# Patient Record
Sex: Male | Born: 1985 | Race: Black or African American | Hispanic: No | Marital: Married | Smoking: Never smoker
Health system: Southern US, Community
[De-identification: ages and names within clinical notes are randomized; demographics above are authoritative.]

---

## 2016-10-21 ENCOUNTER — Encounter (HOSPITAL_BASED_OUTPATIENT_CLINIC_OR_DEPARTMENT_OTHER): Payer: Self-pay | Admitting: *Deleted

## 2016-10-21 ENCOUNTER — Emergency Department (HOSPITAL_BASED_OUTPATIENT_CLINIC_OR_DEPARTMENT_OTHER)
Admission: EM | Admit: 2016-10-21 | Discharge: 2016-10-21 | Disposition: A | Discharge status: Home / Self Care | Payer: Self-pay | Attending: Emergency Medicine | Admitting: Emergency Medicine

## 2016-10-21 DIAGNOSIS — R112 Nausea with vomiting, unspecified: Secondary | ICD-10-CM | POA: Insufficient documentation

## 2016-10-21 DIAGNOSIS — R51 Headache: Secondary | ICD-10-CM | POA: Insufficient documentation

## 2016-10-21 DIAGNOSIS — R519 Headache, unspecified: Secondary | ICD-10-CM

## 2016-10-21 DIAGNOSIS — R509 Fever, unspecified: Secondary | ICD-10-CM | POA: Insufficient documentation

## 2016-10-21 MED ORDER — KETOROLAC TROMETHAMINE 60 MG/2ML IM SOLN
60.0000 mg | Freq: Once | INTRAMUSCULAR | Status: AC
  Administered 2016-10-21: 60 mg via INTRAMUSCULAR
  Filled 2016-10-21: qty 2

## 2016-10-21 MED ORDER — ONDANSETRON 4 MG PO TBDP: 4.0000 mg | ORAL_TABLET | Freq: Three times a day (TID) | ORAL | 0 refills | Status: DC | PRN

## 2016-10-21 MED ORDER — IBUPROFEN 800 MG PO TABS: 800.0000 mg | ORAL_TABLET | Freq: Three times a day (TID) | ORAL | 0 refills | Status: DC

## 2016-10-21 MED ORDER — PREDNISONE 10 MG (21) PO TBPK: ORAL_TABLET | ORAL | 0 refills | Status: DC

## 2016-10-21 NOTE — Discharge Instructions (Signed)
Your symptoms are consistent with a viral illness. Viruses do not require antibiotics. Treatment is symptomatic care and it is important to note that these symptoms may last for 7-10 days. Drink plenty of fluids and get plenty of rest. You should be drinking at least a half liter of water an hour to stay hydrated. Ibuprofen, Naproxen, or Tylenol for pain or fever. Zofran for nausea. Tessalon for cough. Plain Mucinex may help relieve congestion. Warm liquids or Chloraseptic spray may help soothe a sore throat. Follow up with a primary care provider, as needed, for any future management of this issue. °

## 2016-10-21 NOTE — ED Provider Notes (Signed)
[facility]-EMERGENCY DEPT Provider Note   CSN: [id number] Arrival date & time: 10/21/16  2037  By signing my name below, I, Majel HomerPeyton Lee, attest that this documentation has been prepared under the direction and in the presence of non-physician practitioner, Harolyn RutherfordShawn Natayla Cadenhead, PA-C. Electronically Signed: Majel HomerPeyton Lee, Scribe. 10/21/2016. 9:40 PM.  History   Chief Complaint Chief Complaint  Patient presents with  . Headache   The history is provided by the patient. No language interpreter was used.   HPI Comments: Eric Caldwell is a 30 y.o. male who presents to the Emergency Department complaining of constant, moderate, throbbing headache accompanied by Nausea, vomiting, fever, ear pain, and body aches that began 3 days ago. Denies vomiting in the last 24 hours. He states he has taken NyQuil with no relief. He notes he only drinks 2-3 small bottles of water a day. Pt denies diarrhea, cough, neck stiffness, sore throat, rashes or any other complaints.   History reviewed. No pertinent past medical history.  There are no active problems to display for this patient.  History reviewed. No pertinent surgical history.  Home Medications    Prior to Admission medications   Medication Sig Start Date End Date Taking? Authorizing Provider  ibuprofen (ADVIL,MOTRIN) 800 MG tablet Take 1 tablet (800 mg total) by mouth 3 (three) times daily. 10/21/16   Keylen Uzelac C Twylia Oka, PA-C  ondansetron (ZOFRAN ODT) 4 MG disintegrating tablet Take 1 tablet (4 mg total) by mouth every 8 (eight) hours as needed for nausea or vomiting. 10/21/16   Atticus Wedin C Colton Engdahl, PA-C  predniSONE (STERAPRED UNI-PAK 21 TAB) 10 MG (21) TBPK tablet Take 6 tabs day 1, 5 tabs day 2, 4 tabs day 3, 3 tabs day 4, 2 tabs day 5, and 1 tab on day 6. 10/21/16   Johnna Bollier C Notnamed Croucher, PA-C   Family History No family history on file.  Social History Social History  Substance Use Topics  . Smoking status: Never Smoker  . Smokeless tobacco: Never Used  . Alcohol use Yes    Allergies   Patient has no known allergies.  Review of Systems Review of Systems  Constitutional: Positive for fever.  HENT: Positive for ear pain. Negative for sore throat.   Respiratory: Negative for cough.   Cardiovascular: Negative for chest pain.  Gastrointestinal: Positive for nausea and vomiting. Negative for abdominal pain and diarrhea.  Musculoskeletal: Negative for neck stiffness.  Skin: Negative for rash.  Neurological: Positive for headaches. Negative for dizziness, syncope, weakness, light-headedness and numbness.  All other systems reviewed and are negative.  Physical Exam Updated Vital Signs BP 139/70   Pulse 80   Temp 98.3 F (36.8 C) (Oral)   Resp 20   Ht 6' (1.829 m)   Wt 235 lb (106.6 kg)   SpO2 97%   BMI 31.87 kg/m   Physical Exam  Constitutional: He appears well-developed and well-nourished. No distress.  HENT:  Head: Normocephalic and atraumatic.  Erythematous right TM. Tenderness over frontal and maxillary sinuses.   Eyes: Conjunctivae are normal.  Neck: Normal range of motion. Neck supple.  Cardiovascular: Normal rate, regular rhythm, normal heart sounds and intact distal pulses.   Pulmonary/Chest: Effort normal and breath sounds normal. No respiratory distress.  Abdominal: Soft. There is no tenderness. There is no guarding.  Musculoskeletal: He exhibits no edema.  Normal motor function intact in all extremities and spine. No midline spinal tenderness.   Lymphadenopathy:    He has no cervical adenopathy.  Neurological: He is alert.  No sensory deficits. Strength 5/5 in all extremities. No gait disturbance. Coordination intact. Cranial nerves III-XII grossly intact. No facial droop.   Skin: Skin is warm and dry. He is not diaphoretic.  Psychiatric: He has a normal mood and affect. His behavior is normal.  Nursing note and vitals reviewed.  ED Treatments / Results  Labs (all labs ordered are listed, but only abnormal results are  displayed) Labs Reviewed - No data to display  EKG  EKG Interpretation None       Radiology No results found.  Procedures Procedures (including critical care time)  Medications Ordered in ED Medications  ketorolac (TORADOL) injection 60 mg (60 mg Intramuscular Given 10/21/16 2151)    DIAGNOSTIC STUDIES:  Oxygen Saturation is 97% on RA, normal by my interpretation.   COORDINATION OF CARE:  9:37 PM Discussed treatment plan with pt at bedside and pt agreed to plan.  Initial Impression / Assessment and Plan / ED Course  I have reviewed the triage vital signs and the nursing notes.  Pertinent labs & imaging results that were available during my care of the patient were reviewed by me and considered in my medical decision making (see chart for details).  Clinical Course     Patient presents with influenza-like symptoms. Low suspicion for meningitis. Suspect dehydration is contributing to the patient's symptoms. Supportive care and return precautions discussed. Patient voiced understanding of all instructions and is comfortable with discharge.   I personally performed the services described in this documentation, which was scribed in my presence. The recorded information has been reviewed and is accurate.  Final Clinical Impressions(s) / ED Diagnoses   Final diagnoses:  Sinus headache    New Prescriptions Discharge Medication List as of 10/21/2016  9:44 PM    START taking these medications   Details  ibuprofen (ADVIL,MOTRIN) 800 MG tablet Take 1 tablet (800 mg total) by mouth 3 (three) times daily., Starting Sun 10/21/2016, Print    ondansetron (ZOFRAN ODT) 4 MG disintegrating tablet Take 1 tablet (4 mg total) by mouth every 8 (eight) hours as needed for nausea or vomiting., Starting Sun 10/21/2016, Print    predniSONE (STERAPRED UNI-PAK 21 TAB) 10 MG (21) TBPK tablet Take 6 tabs day 1, 5 tabs day 2, 4 tabs day 3, 3 tabs day 4, 2 tabs day 5, and 1 tab on day 6., Print          Anselm PancoastShawn C Mariadelaluz [place], PA-C 10/23/16 0023    Nira ConnPedro Eduardo Cardama, MD 10/23/16 1155

## 2016-10-21 NOTE — ED Triage Notes (Signed)
Pressure in his right ear, headache, fever and body aches x 3 days.

## 2018-02-15 ENCOUNTER — Encounter (HOSPITAL_BASED_OUTPATIENT_CLINIC_OR_DEPARTMENT_OTHER): Payer: Self-pay

## 2018-02-15 ENCOUNTER — Emergency Department (HOSPITAL_BASED_OUTPATIENT_CLINIC_OR_DEPARTMENT_OTHER)
Admission: EM | Admit: 2018-02-15 | Discharge: 2018-02-15 | Disposition: A | Discharge status: Home / Self Care | Payer: Self-pay | Attending: Emergency Medicine | Admitting: Emergency Medicine

## 2018-02-15 ENCOUNTER — Emergency Department (HOSPITAL_BASED_OUTPATIENT_CLINIC_OR_DEPARTMENT_OTHER): Payer: Self-pay

## 2018-02-15 ENCOUNTER — Other Ambulatory Visit: Payer: Self-pay

## 2018-02-15 DIAGNOSIS — N342 Other urethritis: Secondary | ICD-10-CM

## 2018-02-15 DIAGNOSIS — N341 Nonspecific urethritis: Secondary | ICD-10-CM | POA: Insufficient documentation

## 2018-02-15 DIAGNOSIS — R0789 Other chest pain: Secondary | ICD-10-CM

## 2018-02-15 DIAGNOSIS — F1210 Cannabis abuse, uncomplicated: Secondary | ICD-10-CM | POA: Insufficient documentation

## 2018-02-15 LAB — BASIC METABOLIC PANEL
Anion gap: 10 (ref 5–15)
BUN: 14 mg/dL (ref 6–20)
CHLORIDE: 101 mmol/L (ref 101–111)
CO2: 23 mmol/L (ref 22–32)
CREATININE: 1.22 mg/dL (ref 0.61–1.24)
Calcium: 8.7 mg/dL — ABNORMAL LOW (ref 8.9–10.3)
GFR calc Af Amer: >60 mL/min (ref >60)
GFR calc non Af Amer: >60 mL/min (ref >60)
Glucose, Bld: 102 mg/dL — ABNORMAL HIGH (ref 65–99)
Potassium: 3.1 mmol/L — ABNORMAL LOW (ref 3.5–5.1)
Sodium: 134 mmol/L — ABNORMAL LOW (ref 135–145)

## 2018-02-15 LAB — RAPID URINE DRUG SCREEN, HOSP PERFORMED
Amphetamines: NOT DETECTED
BARBITURATES: NOT DETECTED
Benzodiazepines: NOT DETECTED
Cocaine: NOT DETECTED
Opiates: NOT DETECTED
TETRAHYDROCANNABINOL: POSITIVE — AB

## 2018-02-15 LAB — CBC
HCT: 38.9 % — ABNORMAL LOW (ref 39.0–52.0)
Hemoglobin: 13.4 g/dL (ref 13.0–17.0)
MCH: 24.1 pg — ABNORMAL LOW (ref 26.0–34.0)
MCHC: 34.4 g/dL (ref 30.0–36.0)
MCV: 70 fL — AB (ref 78.0–100.0)
PLATELETS: 308 10*3/uL (ref 150–400)
RBC: 5.56 MIL/uL (ref 4.22–5.81)
RDW: 14.9 % (ref 11.5–15.5)
WBC: 6.3 10*3/uL (ref 4.0–10.5)

## 2018-02-15 LAB — URINALYSIS, ROUTINE W REFLEX MICROSCOPIC
BILIRUBIN URINE: NEGATIVE
Glucose, UA: NEGATIVE mg/dL
HGB URINE DIPSTICK: NEGATIVE
Ketones, ur: NEGATIVE mg/dL
Nitrite: NEGATIVE
Protein, ur: NEGATIVE mg/dL
SPECIFIC GRAVITY, URINE: 1.025 (ref 1.005–1.030)
pH: 6 (ref 5.0–8.0)

## 2018-02-15 LAB — URINALYSIS, MICROSCOPIC (REFLEX)

## 2018-02-15 LAB — TROPONIN I
Troponin I: <0.03 ng/mL (ref <0.03)
Troponin I: <0.03 ng/mL (ref <0.03)

## 2018-02-15 LAB — D-DIMER, QUANTITATIVE: D-Dimer, Quant: <0.27 ug/mL-FEU (ref 0.00–0.50)

## 2018-02-15 MED ORDER — AZITHROMYCIN 250 MG PO TABS
1000.0000 mg | ORAL_TABLET | Freq: Once | ORAL | Status: AC
  Administered 2018-02-15: 1000 mg via ORAL
  Filled 2018-02-15: qty 4

## 2018-02-15 MED ORDER — POTASSIUM CHLORIDE CRYS ER 20 MEQ PO TBCR
40.0000 meq | EXTENDED_RELEASE_TABLET | Freq: Once | ORAL | Status: AC
  Administered 2018-02-15: 40 meq via ORAL
  Filled 2018-02-15: qty 2

## 2018-02-15 MED ORDER — CEFTRIAXONE SODIUM 250 MG IJ SOLR
250.0000 mg | Freq: Once | INTRAMUSCULAR | Status: AC
  Administered 2018-02-15: 250 mg via INTRAMUSCULAR
  Filled 2018-02-15: qty 250

## 2018-02-15 MED ORDER — NAPROXEN 500 MG PO TABS: 500.0000 mg | ORAL_TABLET | Freq: Two times a day (BID) | ORAL | 0 refills | Status: DC

## 2018-02-15 NOTE — ED Triage Notes (Signed)
Pt presents with right sided flank pain and dysuria.

## 2018-02-15 NOTE — Discharge Instructions (Addendum)
There is no evidence of heart attack or blood clot in the lung.  No broken ribs.  You will be called if her urine grows anything that needs further treatment.  Establish care with a primary doctor.  Return to the ED if you develop new or worsening symptoms.

## 2018-02-15 NOTE — ED Provider Notes (Signed)
[facility] EMERGENCY DEPARTMENT Provider Note   CSN: [id number] Arrival date & time: 02/15/18  0131     History   Chief Complaint Chief Complaint  Patient presents with  . Flank Pain    HPI Eric Caldwell is a 32 y.o. male.  Patient presents with an episode of central chest tightness and shortness of breath at onset several hours ago while he was smoking marijuana.  He states the tightness was in the center of his chest and lasted for several minutes before resolving on its own.  He feels he is breathing normally now.  No nausea, vomiting or diaphoresis.  Patient is never had this kind of pain before.  He is has had right sided mid back and rib pain for several days without injury.  He states he fell off a ladder several years ago but not nothing recently.  Denies any blood in his urine but does have some pain with urination.  No fever, chills, nausea or vomiting.  No abdominal pain.  The history is provided by the patient.  Flank Pain  Associated symptoms include chest pain and shortness of breath. Pertinent negatives include no abdominal pain and no headaches.    History reviewed. No pertinent past medical history.  There are no active problems to display for this patient.   History reviewed. No pertinent surgical history.      Home Medications    Prior to Admission medications   Medication Sig Start Date End Date Taking? Authorizing Provider  ibuprofen (ADVIL,MOTRIN) 800 MG tablet Take 1 tablet (800 mg total) by mouth 3 (three) times daily. 10/21/16  Yes Joy, Shawn C, PA-C  ondansetron (ZOFRAN ODT) 4 MG disintegrating tablet Take 1 tablet (4 mg total) by mouth every 8 (eight) hours as needed for nausea or vomiting. 10/21/16  Yes Joy, Shawn C, PA-C  predniSONE (STERAPRED UNI-PAK 21 TAB) 10 MG (21) TBPK tablet Take 6 tabs day 1, 5 tabs day 2, 4 tabs day 3, 3 tabs day 4, 2 tabs day 5, and 1 tab on day 6. 10/21/16  Yes Joy, Shawn C, PA-C    Family History No  family history on file.  Social History Social History   Tobacco Use  . Smoking status: Never Smoker  . Smokeless tobacco: Never Used  Substance Use Topics  . Alcohol use: Yes  . Drug use: Yes    Types: Marijuana     Allergies   Patient has no known allergies.   Review of Systems Review of Systems  Constitutional: Negative for activity change, appetite change and fever.  HENT: Negative for congestion and rhinorrhea.   Eyes: Negative for visual disturbance.  Respiratory: Positive for chest tightness and shortness of breath.   Cardiovascular: Positive for chest pain.  Gastrointestinal: Negative for abdominal pain, nausea and vomiting.  Genitourinary: Positive for dysuria and flank pain. Negative for hematuria.  Musculoskeletal: Positive for back pain.  Skin: Negative for rash.  Neurological: Negative for dizziness, weakness and headaches.   all other systems are negative except as noted in the HPI and PMH.     Physical Exam Updated Vital Signs BP (!) 142/82 (BP Location: Right Arm)   Pulse 75   Temp 97.6 F (36.4 C) (Oral)   Resp 18   Ht 6' (1.829 m)   Wt 113.4 kg (250 lb)   SpO2 99%   BMI 33.91 kg/m   Physical Exam  Constitutional: He is oriented to person, place, and time. He appears well-developed and  well-nourished. No distress.  HENT:  Head: Normocephalic and atraumatic.  Mouth/Throat: Oropharynx is clear and moist. No oropharyngeal exudate.  Eyes: Pupils are equal, round, and reactive to light. Conjunctivae and EOM are normal.  Neck: Normal range of motion. Neck supple.  No meningismus.  Cardiovascular: Normal rate, regular rhythm, normal heart sounds and intact distal pulses.  No murmur heard. Pulmonary/Chest: Effort normal and breath sounds normal. No respiratory distress. He has no wheezes.  Right mid thoracic and lateral rib tenderness, no ecchymosis or crepitance  Abdominal: Soft. There is no tenderness. There is no rebound and no guarding.    Musculoskeletal: Normal range of motion. He exhibits no edema or tenderness.  No CVAT  Neurological: He is alert and oriented to person, place, and time. No cranial nerve deficit. He exhibits normal muscle tone. Coordination normal.  No ataxia on finger to nose bilaterally. No pronator drift. 5/5 strength throughout. CN 2-12 intact.Equal grip strength. Sensation intact.   Skin: Skin is warm. Capillary refill takes less than 2 seconds. No rash noted.  Psychiatric: He has a normal mood and affect. His behavior is normal.  Nursing note and vitals reviewed.    ED Treatments / Results  Labs (all labs ordered are listed, but only abnormal results are displayed) Labs Reviewed  URINALYSIS, ROUTINE W REFLEX MICROSCOPIC - Abnormal; Notable for the following components:      Result Value   APPearance CLOUDY (*)    Leukocytes, UA MODERATE (*)    All other components within normal limits  BASIC METABOLIC PANEL - Abnormal; Notable for the following components:   Sodium 134 (*)    Potassium 3.1 (*)    Glucose, Bld 102 (*)    Calcium 8.7 (*)    All other components within normal limits  CBC - Abnormal; Notable for the following components:   HCT 38.9 (*)    MCV 70.0 (*)    MCH 24.1 (*)    All other components within normal limits  RAPID URINE DRUG SCREEN, HOSP PERFORMED - Abnormal; Notable for the following components:   Tetrahydrocannabinol POSITIVE (*)    All other components within normal limits  URINALYSIS, MICROSCOPIC (REFLEX) - Abnormal; Notable for the following components:   Bacteria, UA MANY (*)    Squamous Epithelial / LPF 0-5 (*)    All other components within normal limits  URINE CULTURE  TROPONIN I  D-DIMER, QUANTITATIVE (NOT AT [facility])  TROPONIN I    EKG EKG Interpretation  Date/Time:  Saturday February 15 2018 02:09:57 EDT Ventricular Rate:  75 PR Interval:    QRS Duration: 93 QT Interval:  361 QTC Calculation: 404 R Axis:   38 Text Interpretation:  Sinus rhythm ST  elev, probable normal early repol pattern No previous ECGs available Confirmed by Glynn Octave [phone]) on 02/15/2018 2:34:59 AM   Radiology Dg Chest 2 View  Result Date: 02/15/2018 CLINICAL DATA:  Acute onset of generalized chest pain and difficulty breathing. Chest numbness. EXAM: CHEST - 2 VIEW COMPARISON:  None. FINDINGS: The lungs are well-aerated and clear. There is no evidence of focal opacification, pleural effusion or pneumothorax. The heart is normal in size; the mediastinal contour is within normal limits. No acute osseous abnormalities are seen. IMPRESSION: No acute cardiopulmonary process seen. Electronically Signed   By: Roanna Raider M.D.   On: 02/15/2018 02:53    Procedures Procedures (including critical care time)  Medications Ordered in ED Medications - No data to display   Initial Impression / Assessment  and Plan / ED Course  I have reviewed the triage vital signs and the nursing notes.  Pertinent labs & imaging results that were available during my care of the patient were reviewed by me and considered in my medical decision making (see chart for details).    Episode of chest tightness and shortness of breath at onset after smoking marijuana, now resolved.  EKG is nonischemic.  Ongoing right rib pain for several days worse with inspiration.  Will check d-dimer.  EKG is nonischemic.  Troponin negative x2.  D-dimer negative.  Chest x-ray negative.  Patient's "flank" pain is actually more right lateral rib pain.  He does not have any CVA tenderness.  His urinalysis shows no blood but does show many white cells suspicious for urethritis.  Patient treated empirically with IM Rocephin and p.o. Zithromax. He denies any testicular pain or abdominal pain. Urine culture sent.  No evidence of ACS or PE. Cease marijuana use.  Patient feels improved.  No CVA pain on recheck.  Doubt infected kidney stone.  Follow-up with PCP.  Return precautions discussed.  Final Clinical  Impressions(s) / ED Diagnoses   Final diagnoses:  Atypical chest pain  Urethritis    ED Discharge Orders    None       Demorio Seeley, Jeannett SeniorStephen, MD 02/15/18 [phone]

## 2018-02-16 LAB — URINE CULTURE: Culture: NO GROWTH

## 2018-10-22 IMAGING — DX DG CHEST 2V
2 series · 2 of 2 positions shown · non-contrast
Comparison: None.

CLINICAL DATA: Acute onset of generalized chest pain and difficulty
breathing. Chest numbness.

EXAM:
CHEST - 2 VIEW

[chest pa]
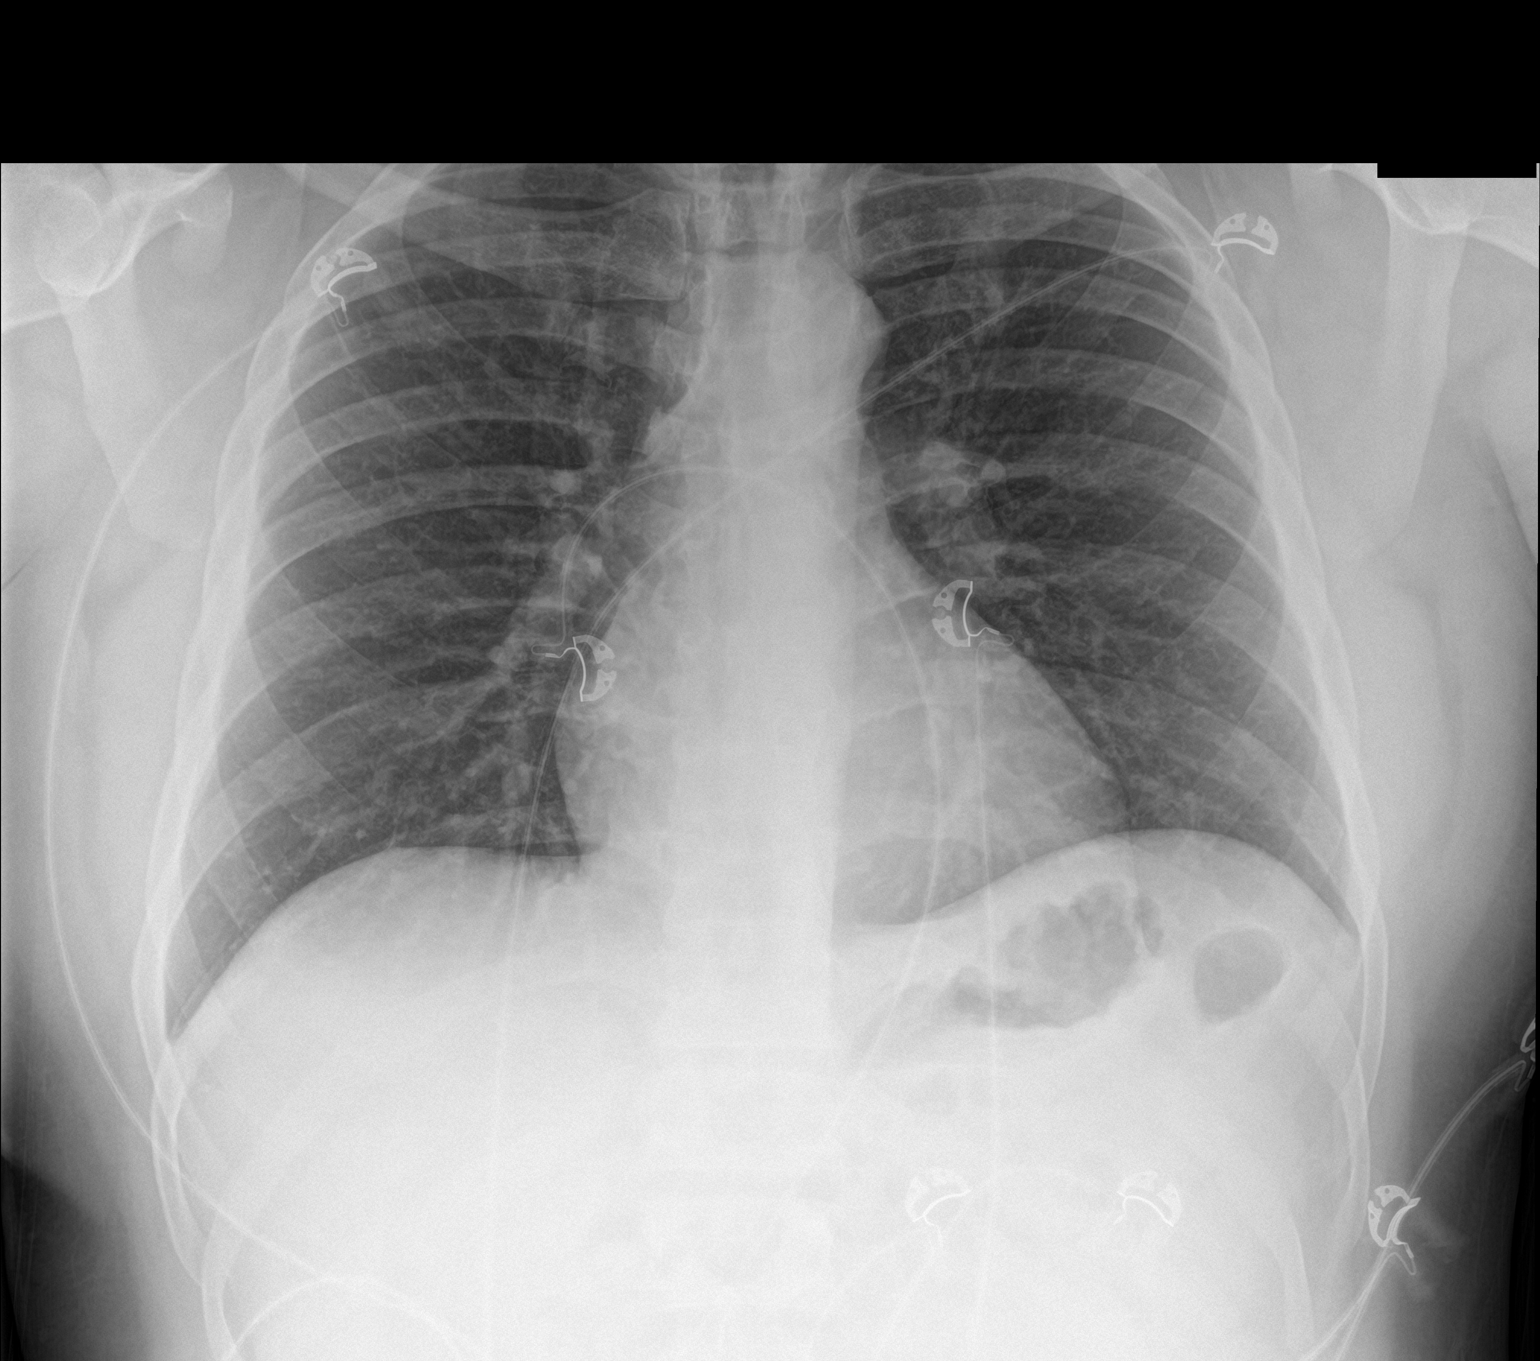

[chest lat]
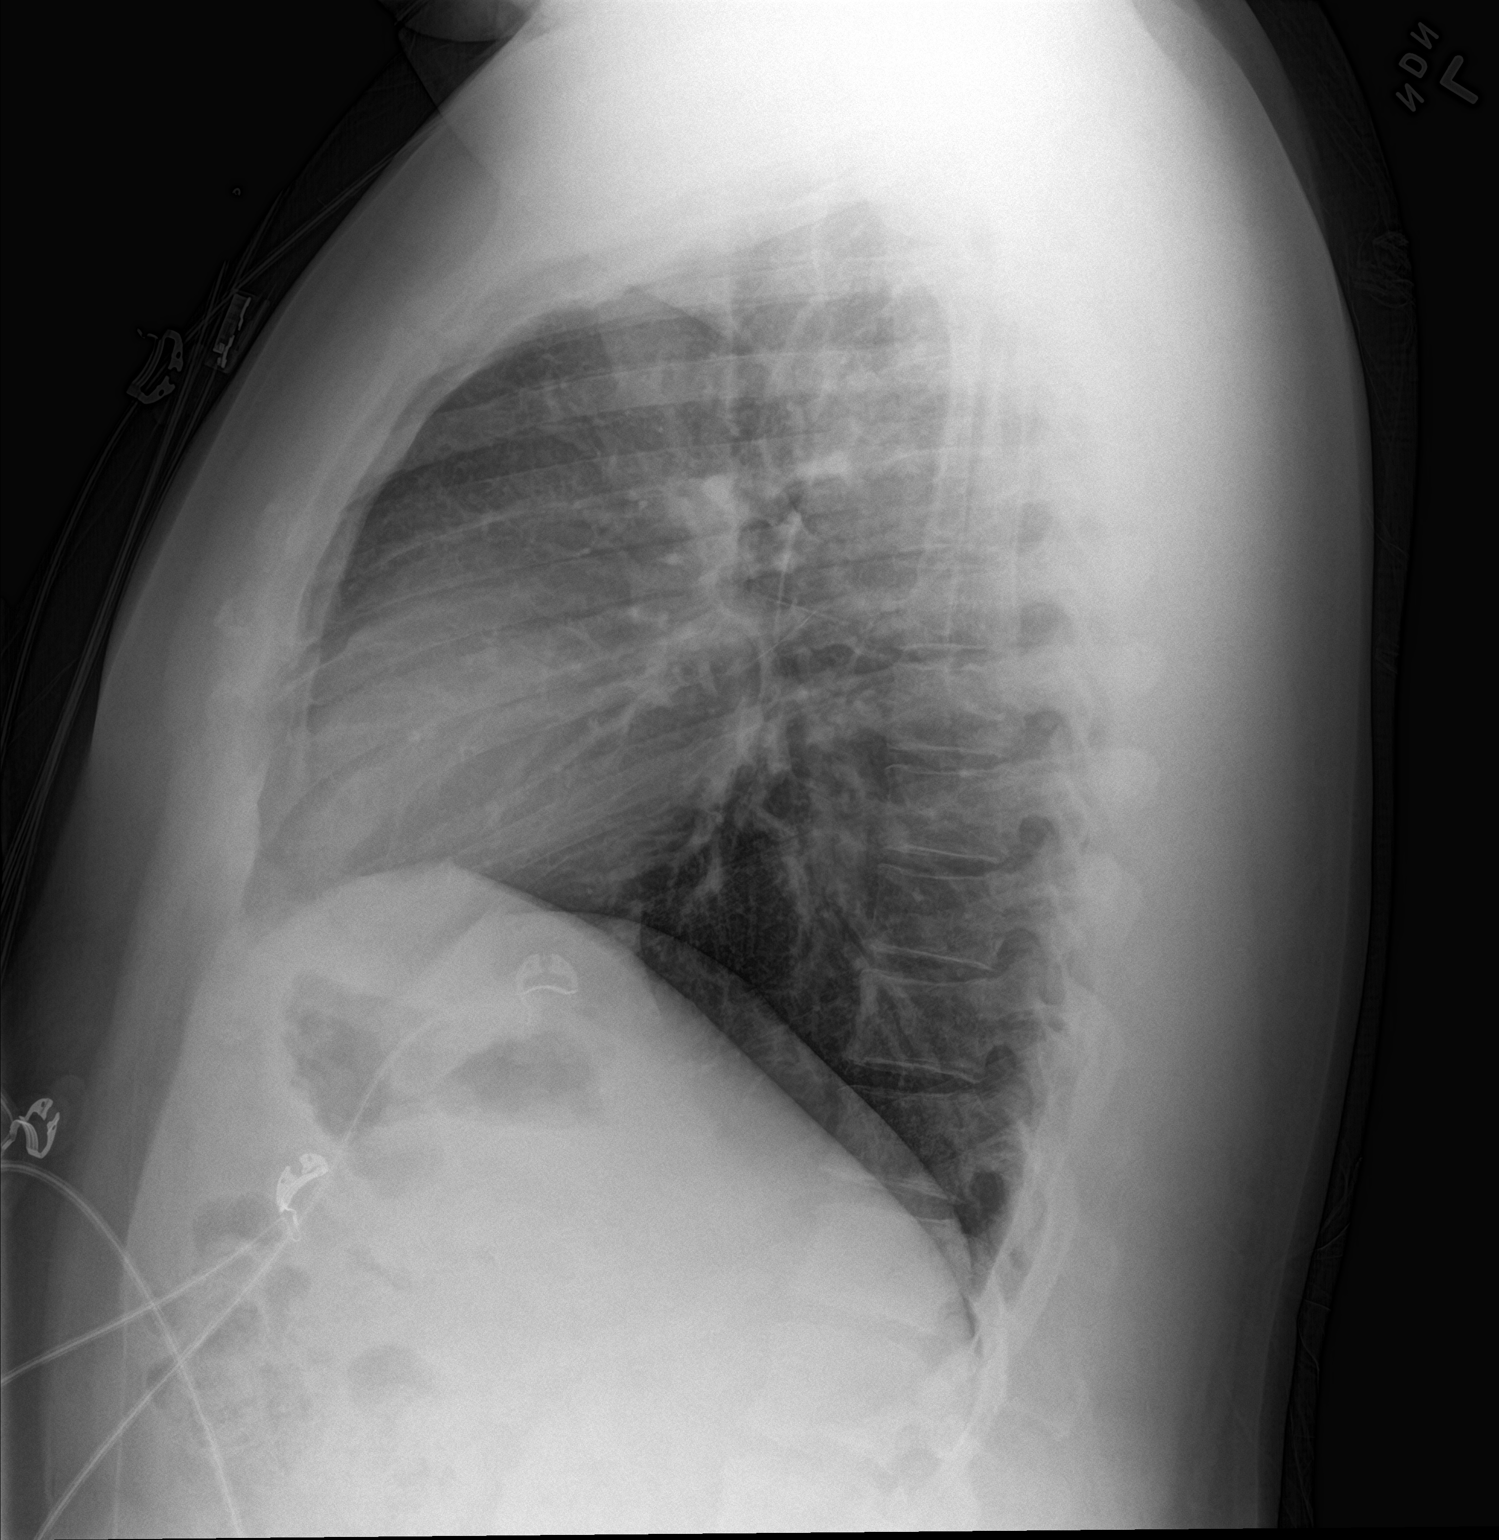

[2 of 2 positions shown; findings below may reference images not displayed]

FINDINGS: The lungs are well-aerated and clear. There is no evidence of focal
opacification, pleural effusion or pneumothorax.

The heart is normal in size; the mediastinal contour is within
normal limits. No acute osseous abnormalities are seen.
IMPRESSION: No acute cardiopulmonary process seen.

## 2020-12-08 ENCOUNTER — Emergency Department (HOSPITAL_BASED_OUTPATIENT_CLINIC_OR_DEPARTMENT_OTHER)
Admission: EM | Admit: 2020-12-08 | Discharge: 2020-12-08 | Disposition: A | Discharge status: Home / Self Care | Payer: Self-pay | Attending: Emergency Medicine | Admitting: Emergency Medicine

## 2020-12-08 ENCOUNTER — Other Ambulatory Visit: Payer: Self-pay

## 2020-12-08 ENCOUNTER — Encounter (HOSPITAL_BASED_OUTPATIENT_CLINIC_OR_DEPARTMENT_OTHER): Payer: Self-pay | Admitting: *Deleted

## 2020-12-08 DIAGNOSIS — K1120 Sialoadenitis, unspecified: Secondary | ICD-10-CM | POA: Insufficient documentation

## 2020-12-08 MED ORDER — IBUPROFEN 400 MG PO TABS
600.0000 mg | ORAL_TABLET | Freq: Once | ORAL | Status: AC
  Administered 2020-12-08: 600 mg via ORAL
  Filled 2020-12-08: qty 1

## 2020-12-08 NOTE — ED Provider Notes (Signed)
[facility] EMERGENCY DEPARTMENT Provider Note   CSN: [id number] Arrival date & time: 12/08/20  1825     History Chief Complaint  Patient presents with  . Neck Pain    Eric Caldwell is a 35 y.o. male.  HPI 35 year old male presents with left neck pain and left floor of the mouth pain.  Started this morning.  Was not too bad at first but seems to be worsening throughout the day, especially when he tries to eat.  No purulent discharge.  No fevers.  No sore throat, cough.   No past medical history on file.  There are no problems to display for this patient.   History reviewed. No pertinent surgical history.     No family history on file.  Social History   Tobacco Use  . Smoking status: Never Smoker  . Smokeless tobacco: Never Used  Substance Use Topics  . Alcohol use: Yes  . Drug use: Yes    Types: Marijuana    Home Medications Prior to Admission medications   Medication Sig Start Date End Date Taking? Authorizing Provider  ibuprofen (ADVIL,MOTRIN) 800 MG tablet Take 1 tablet (800 mg total) by mouth 3 (three) times daily. 10/21/16   Joy, Shawn C, PA-C  naproxen (NAPROSYN) 500 MG tablet Take 1 tablet (500 mg total) by mouth 2 (two) times daily. 02/15/18   Rancour, Jeannett Senior, MD  ondansetron (ZOFRAN ODT) 4 MG disintegrating tablet Take 1 tablet (4 mg total) by mouth every 8 (eight) hours as needed for nausea or vomiting. 10/21/16   Joy, Shawn C, PA-C  predniSONE (STERAPRED UNI-PAK 21 TAB) 10 MG (21) TBPK tablet Take 6 tabs day 1, 5 tabs day 2, 4 tabs day 3, 3 tabs day 4, 2 tabs day 5, and 1 tab on day 6. 10/21/16   Joy, Shawn C, PA-C    Allergies    Patient has no known allergies.  Review of Systems   Review of Systems  Constitutional: Negative for fever.  HENT: Negative for sore throat and trouble swallowing.   Musculoskeletal: Positive for neck pain.    Physical Exam Updated Vital Signs BP 115/76   Pulse 67   Temp 98 F (36.7 C)   Resp 16   Ht  6' (1.829 m)   Wt 117.9 kg   SpO2 100%   BMI 35.26 kg/m   Physical Exam Vitals and nursing note reviewed.  Constitutional:      Appearance: He is well-developed and well-nourished.  HENT:     Head: Normocephalic and atraumatic.     Right Ear: External ear normal.     Left Ear: External ear normal.     Nose: Nose normal.     Mouth/Throat:     Pharynx: No pharyngeal swelling, oropharyngeal exudate or posterior oropharyngeal erythema.     Tonsils: No tonsillar exudate or tonsillar abscesses.     Comments: No obvious swelling to floor of mouth Eyes:     General:        Right eye: No discharge.        Left eye: No discharge.  Neck:   Cardiovascular:     Rate and Rhythm: Normal rate and regular rhythm.     Heart sounds: Normal heart sounds.  Pulmonary:     Effort: Pulmonary effort is normal.     Breath sounds: Normal breath sounds.  Abdominal:     Palpations: Abdomen is soft.     Tenderness: There is no abdominal tenderness.  Musculoskeletal:        General: No edema.     Cervical back: Neck supple.  Skin:    General: Skin is warm and dry.  Neurological:     Mental Status: He is alert.  Psychiatric:        Mood and Affect: Mood is not anxious.     ED Results / Procedures / Treatments   Labs (all labs ordered are listed, but only abnormal results are displayed) Labs Reviewed - No data to display  EKG None  Radiology No results found.  Procedures Procedures   Medications Ordered in ED Medications  ibuprofen (ADVIL) tablet 600 mg (600 mg Oral Given 12/08/20 1933)    ED Course  I have reviewed the triage vital signs and the nursing notes.  Pertinent labs & imaging results that were available during my care of the patient were reviewed by me and considered in my medical decision making (see chart for details).    MDM Rules/Calculators/A&P                          Presentation is most consistent with sialoadenitis.  I do not see an obvious stone on exam  and there is no purulent discharge or fever.  I doubt this is infected, especially given that started today.  Will recommend hard, tart candies as well as NSAIDs.  Return if symptoms do not improve or worsen.  Advised him to follow-up with a PCP. Final Clinical Impression(s) / ED Diagnoses Final diagnoses:  Sialoadenitis    Rx / DC Orders ED Discharge Orders    None       Pricilla Loveless, MD 12/08/20 [phone]

## 2020-12-08 NOTE — Discharge Instructions (Signed)
If you develop new or worsening pain, fever, vomiting, trouble breathing/swallowing, or any other new/concerning symptoms, return to the ER or call 911

## 2020-12-08 NOTE — ED Triage Notes (Signed)
C/o " knot" to left side of neck also c/o ear pain

## 2022-05-23 DIAGNOSIS — R300 Dysuria: Secondary | ICD-10-CM | POA: Diagnosis not present

## 2022-09-23 ENCOUNTER — Encounter (HOSPITAL_BASED_OUTPATIENT_CLINIC_OR_DEPARTMENT_OTHER): Payer: Self-pay | Admitting: Emergency Medicine

## 2022-09-23 ENCOUNTER — Other Ambulatory Visit: Payer: Self-pay

## 2022-09-23 ENCOUNTER — Emergency Department (HOSPITAL_BASED_OUTPATIENT_CLINIC_OR_DEPARTMENT_OTHER)
Admission: EM | Admit: 2022-09-23 | Discharge: 2022-09-23 | Disposition: A | Discharge status: Home / Self Care | Payer: Self-pay | Attending: Emergency Medicine | Admitting: Emergency Medicine

## 2022-09-23 DIAGNOSIS — Z20822 Contact with and (suspected) exposure to covid-19: Secondary | ICD-10-CM | POA: Insufficient documentation

## 2022-09-23 DIAGNOSIS — J069 Acute upper respiratory infection, unspecified: Secondary | ICD-10-CM | POA: Insufficient documentation

## 2022-09-23 LAB — RESP PANEL BY RT-PCR (FLU A&B, COVID) ARPGX2
Influenza A by PCR: NEGATIVE
Influenza B by PCR: NEGATIVE
SARS Coronavirus 2 by RT PCR: NEGATIVE

## 2022-09-23 LAB — GROUP A STREP BY PCR: Group A Strep by PCR: NOT DETECTED

## 2022-09-23 NOTE — ED Triage Notes (Signed)
Patient reports started having chills last night, and then about an hour later felt feverish. Patient reports he also has a sore throat.

## 2022-09-23 NOTE — Discharge Instructions (Signed)
Please read and follow all provided instructions.  Your diagnoses today include:  1. Viral upper respiratory tract infection     You appear to have an upper respiratory infection (URI). An upper respiratory tract infection, or cold, is a viral infection of the air passages leading to the lungs. It should improve gradually after 5-7 days. You may have a lingering cough that lasts for 2- 4 weeks after the infection.  Tests performed today include: Vital signs. See below for your results today.  Covid/flu testing: negative Strep testing: negative  Medications prescribed:  Please use over-the-counter NSAID medications (ibuprofen, naproxen) or Tylenol (acetaminophen) as directed on the packaging for pain -- as long as you do not have any reasons avoid these medications. Reasons to avoid NSAID medications include: weak kidneys, a history of bleeding in your stomach or gut, or uncontrolled high blood pressure or previous heart attack. Reasons to avoid Tylenol include: liver problems or ongoing alcohol use. Never take more than 4000mg  or 8 Extra strength Tylenol in a 24 hour period.     Take any prescribed medications only as directed. Treatment for your infection is aimed at treating the symptoms. There are no medications, such as antibiotics, that will cure your infection.   Home care instructions:  You can take Tylenol and/or Ibuprofen as directed on the packaging for fever reduction and pain relief.    For cough: honey 1/2 to 1 teaspoon (you can dilute the honey in water or another fluid).  You can also use guaifenesin and dextromethorphan for cough. You can use a humidifier for chest congestion and cough.  If you don't have a humidifier, you can sit in the bathroom with the hot shower running.      For sore throat: try warm salt water gargles, cepacol lozenges, throat spray, warm tea or water with lemon/honey, popsicles or ice, or OTC cold relief medicine for throat discomfort.    For  congestion: take a daily anti-histamine like Zyrtec, Claritin, and a oral decongestant, such as pseudoephedrine.  You can also use Flonase 1-2 sprays in each nostril daily.    It is important to stay hydrated: drink plenty of fluids (water, gatorade/powerade/pedialyte, juices, or teas) to keep your throat moisturized and help further relieve irritation/discomfort.   Your illness is contagious and can be spread to others, especially during the first 3 or 4 days. It cannot be cured by antibiotics or other medicines. Take basic precautions such as washing your hands often, covering your mouth when you cough or sneeze, and avoiding public places where you could spread your illness to others.   Please continue drinking plenty of fluids.  Use over-the-counter medicines as needed as directed on packaging for symptom relief.  You may also use ibuprofen or tylenol as directed on packaging for pain or fever.  Do not take multiple medicines containing Tylenol or acetaminophen to avoid taking too much of this medication.  Follow-up instructions: Please follow-up with your primary care provider in the next 3 days for further evaluation of your symptoms if you are not feeling better.   Return instructions:  Please return to the Emergency Department if you experience worsening symptoms.  RETURN IMMEDIATELY IF you develop shortness of breath, confusion or altered mental status, a new rash, become dizzy, faint, or poorly responsive, or are unable to be cared for at home. Please return if you have persistent vomiting and cannot keep down fluids or develop a fever that is not controlled by tylenol or motrin.  Please return if you have any other emergent concerns.  Additional Information:  Your vital signs today were: BP 130/79   Pulse 76   Temp 98.2 F (36.8 C) (Oral)   Resp 16   Ht 6' (1.829 m)   Wt 120.2 kg   SpO2 100%   BMI 35.94 kg/m  If your blood pressure (BP) was elevated above 135/85 this visit,  please have this repeated by your doctor within one month. --------------

## 2022-09-23 NOTE — ED Provider Notes (Signed)
[facility] EMERGENCY DEPARTMENT Provider Note   CSN: [id number] Arrival date & time: 09/23/22  1114     History  Chief Complaint  Patient presents with   Chills    Eric Caldwell is a 36 y.o. male.  Patient presents to the emergency department today for evaluation of fever, chills, sore throat.  Symptoms started yesterday.  No known sick contacts.  Throat is "raspy".  Patient had chills and subjective fever at home.  Also with some bodyaches.  No vomiting or diarrhea.  No significant cough.  States that he has been working a lot recently.       Home Medications Prior to Admission medications   Medication Sig Start Date End Date Taking? Authorizing Provider  ibuprofen (ADVIL,MOTRIN) 800 MG tablet Take 1 tablet (800 mg total) by mouth 3 (three) times daily. 10/21/16   Joy, Shawn C, PA-C  naproxen (NAPROSYN) 500 MG tablet Take 1 tablet (500 mg total) by mouth 2 (two) times daily. 02/15/18   Rancour, Jeannett Senior, MD  ondansetron (ZOFRAN ODT) 4 MG disintegrating tablet Take 1 tablet (4 mg total) by mouth every 8 (eight) hours as needed for nausea or vomiting. 10/21/16   Joy, Shawn C, PA-C  predniSONE (STERAPRED UNI-PAK 21 TAB) 10 MG (21) TBPK tablet Take 6 tabs day 1, 5 tabs day 2, 4 tabs day 3, 3 tabs day 4, 2 tabs day 5, and 1 tab on day 6. 10/21/16   Joy, Shawn C, PA-C      Allergies    Patient has no known allergies.    Review of Systems   Review of Systems  Physical Exam Updated Vital Signs BP 130/79   Pulse 76   Temp 98.2 F (36.8 C) (Oral)   Resp 16   Ht 6' (1.829 m)   Wt 120.2 kg   SpO2 100%   BMI 35.94 kg/m  Physical Exam Vitals and nursing note reviewed.  Constitutional:      Appearance: He is well-developed.  HENT:     Head: Normocephalic and atraumatic.     Jaw: No trismus.     Right Ear: Tympanic membrane, ear canal and external ear normal.     Left Ear: Tympanic membrane, ear canal and external ear normal.     Nose: Congestion present. No  mucosal edema or rhinorrhea.     Mouth/Throat:     Mouth: Mucous membranes are not dry.     Pharynx: Uvula midline. Posterior oropharyngeal erythema present. No oropharyngeal exudate or uvula swelling.     Tonsils: No tonsillar abscesses.  Eyes:     General:        Right eye: No discharge.        Left eye: No discharge.     Conjunctiva/sclera: Conjunctivae normal.  Cardiovascular:     Rate and Rhythm: Normal rate and regular rhythm.     Heart sounds: Normal heart sounds.  Pulmonary:     Effort: Pulmonary effort is normal. No respiratory distress.     Breath sounds: Normal breath sounds. No wheezing or rales.  Abdominal:     Palpations: Abdomen is soft.     Tenderness: There is no abdominal tenderness.  Musculoskeletal:     Cervical back: Normal range of motion and neck supple.  Skin:    General: Skin is warm and dry.  Neurological:     Mental Status: He is alert.     ED Results / Procedures / Treatments   Labs (all labs ordered are  listed, but only abnormal results are displayed) Labs Reviewed  GROUP A STREP BY PCR  RESP PANEL BY RT-PCR (FLU A&B, COVID) [id number]    EKG None  Radiology No results found.  Procedures Procedures    Medications Ordered in ED Medications - No data to display  ED Course/ Medical Decision Making/ A&P    Patient seen and examined. History obtained directly from patient.   Labs/EKG: Ordered COVID, flu, strep.  Imaging: None ordered  Medications/Fluids: None ordered  Most recent vital signs reviewed and are as follows: BP 130/79   Pulse 76   Temp 98.2 F (36.8 C) (Oral)   Resp 16   Ht 6' (1.829 m)   Wt 120.2 kg   SpO2 100%   BMI 35.94 kg/m   Initial impression: Upper respiratory infection  12:18 PM Reassessment performed. Patient appears stable.  Labs personally reviewed and interpreted including: Negative, COVID, flu, strep  Reviewed pertinent lab work and imaging with patient at bedside. Questions answered.   Most  current vital signs reviewed and are as follows: BP 130/79   Pulse 76   Temp 98.2 F (36.8 C) (Oral)   Resp 16   Ht 6' (1.829 m)   Wt 120.2 kg   SpO2 100%   BMI 35.94 kg/m   Plan: Discharge to home.   Prescriptions written for: None  Other home care instructions discussed: Rest, hydration, OTC meds  ED return instructions discussed: Worsening shortness of breath, trouble breathing, persistent vomiting or other concerns  Follow-up instructions discussed: Patient encouraged to follow-up with their PCP in 5 days if not improving.                           Medical Decision Making  Patient with symptoms consistent with a viral syndrome.  Negative strep, COVID, flu.  Low concern for pneumonia.  Vitals are stable, no fever. No signs of dehydration. Supportive therapy indicated with return if symptoms worsen.           Final Clinical Impression(s) / ED Diagnoses Final diagnoses:  Viral upper respiratory tract infection    Rx / DC Orders ED Discharge Orders     None         Carlisle Cater, Hershal Coria 09/23/22 1219    Fransico Meadow, MD 09/30/22 1310

## 2023-09-04 ENCOUNTER — Ambulatory Visit: Payer: BC Managed Care – PPO | Admitting: Nurse Practitioner

## 2023-09-11 ENCOUNTER — Encounter: Payer: Self-pay | Admitting: Nurse Practitioner

## 2023-09-11 ENCOUNTER — Ambulatory Visit (INDEPENDENT_AMBULATORY_CARE_PROVIDER_SITE_OTHER): Payer: BC Managed Care – PPO | Admitting: Nurse Practitioner

## 2023-09-11 VITALS — BP 126/73 | HR 60 | Temp 97.0°F | Resp 16 | Ht 72.0 in | Wt 270.0 lb

## 2023-09-11 DIAGNOSIS — Z13220 Encounter for screening for lipoid disorders: Secondary | ICD-10-CM

## 2023-09-11 DIAGNOSIS — Z0000 Encounter for general adult medical examination without abnormal findings: Secondary | ICD-10-CM | POA: Diagnosis not present

## 2023-09-11 DIAGNOSIS — Z1329 Encounter for screening for other suspected endocrine disorder: Secondary | ICD-10-CM

## 2023-09-11 LAB — POCT GLYCOSYLATED HEMOGLOBIN (HGB A1C): Hemoglobin A1C: 6.1 % — AB (ref 4.0–5.6)

## 2023-09-11 NOTE — Patient Instructions (Addendum)
1. Thyroid disorder screen  - TSH  2. Lipid screening  - Lipid Panel  3. Routine adult health maintenance  - CBC - Comprehensive metabolic panel    Follow up:  Follow up in 3 months

## 2023-09-11 NOTE — Progress Notes (Signed)
   Subjective   Patient ID: Eric Caldwell, male    DOB: 11/29/1985, 37 y.o.   MRN: [id number]  Chief Complaint  Patient presents with   Establish Care    Referring provider: No ref. provider found  Eric Caldwell is a 37 y.o. male with No past medical history on file.   HPI  Patient presents today to establish care.  He states that he has not been to see a PCP in several years.  He would like to get it with a primary care physician stay on top of his health.  His wife was recently diagnosed with prediabetes.  He states that him and his wife have been trying to improve their diet and exercise more.  Patient's A1c today in office was elevated.  We discussed that he does also have prediabetes.  Handouts were given on diabetic diet.  Patient is encouraged to exercise more. Denies f/c/s, n/v/d, hemoptysis, PND, leg swelling Denies chest pain or edema      No Known Allergies   There is no immunization history on file for this patient.  Tobacco History: Social History   Tobacco Use  Smoking Status Never  Smokeless Tobacco Never   Counseling given: Not Answered   No outpatient encounter medications on file as of 09/11/2023.   No facility-administered encounter medications on file as of 09/11/2023.    Review of Systems  Review of Systems  Constitutional: Negative.   HENT: Negative.    Cardiovascular: Negative.   Gastrointestinal: Negative.   Allergic/Immunologic: Negative.   Neurological: Negative.   Psychiatric/Behavioral: Negative.       Objective:   BP 126/73   Pulse 60   Temp (!) 97 F (36.1 C) (Tympanic)   Resp 16   Ht 6' (1.829 m)   Wt 270 lb (122.5 kg)   SpO2 99%   BMI 36.62 kg/m   Wt Readings from Last 5 Encounters:  09/11/23 270 lb (122.5 kg)  09/23/22 265 lb (120.2 kg)  12/08/20 260 lb (117.9 kg)  02/15/18 250 lb (113.4 kg)  10/21/16 235 lb (106.6 kg)     Physical Exam Vitals and nursing note reviewed.  Constitutional:      General: He is  not in acute distress.    Appearance: He is well-developed.  Cardiovascular:     Rate and Rhythm: Normal rate and regular rhythm.  Pulmonary:     Effort: Pulmonary effort is normal.     Breath sounds: Normal breath sounds.  Skin:    General: Skin is warm and dry.  Neurological:     Mental Status: He is alert and oriented to person, place, and time.       Assessment & Plan:   Thyroid disorder screen -     TSH; Future  Lipid screening -     Lipid panel; Future  Routine adult health maintenance -     CBC; Future -     Comprehensive metabolic panel; Future -     POCT glycosylated hemoglobin (Hb A1C)     Return in about 3 months (around 12/12/2023) for prediabetes.    Ivonne Andrew, NP 09/11/2023

## 2023-09-18 ENCOUNTER — Other Ambulatory Visit: Payer: BC Managed Care – PPO

## 2023-09-18 DIAGNOSIS — Z0000 Encounter for general adult medical examination without abnormal findings: Secondary | ICD-10-CM

## 2023-09-18 DIAGNOSIS — Z1329 Encounter for screening for other suspected endocrine disorder: Secondary | ICD-10-CM

## 2023-09-18 DIAGNOSIS — Z13220 Encounter for screening for lipoid disorders: Secondary | ICD-10-CM | POA: Diagnosis not present

## 2023-09-19 LAB — COMPREHENSIVE METABOLIC PANEL
ALT: 28 [IU]/L (ref 0–44)
AST: 17 [IU]/L (ref 0–40)
Albumin: 4.2 g/dL (ref 4.1–5.1)
Alkaline Phosphatase: 101 [IU]/L (ref 44–121)
BUN/Creatinine Ratio: 19 (ref 9–20)
BUN: 20 mg/dL (ref 6–20)
Bilirubin Total: 0.3 mg/dL (ref 0.0–1.2)
CO2: 22 mmol/L (ref 20–29)
Calcium: 9.3 mg/dL (ref 8.7–10.2)
Chloride: 104 mmol/L (ref 96–106)
Creatinine, Ser: 1.07 mg/dL (ref 0.76–1.27)
Globulin, Total: 3.3 g/dL (ref 1.5–4.5)
Glucose: 107 mg/dL — ABNORMAL HIGH (ref 70–99)
Potassium: 4.7 mmol/L (ref 3.5–5.2)
Sodium: 138 mmol/L (ref 134–144)
Total Protein: 7.5 g/dL (ref 6.0–8.5)
eGFR: 92 mL/min/{1.73_m2} (ref >59)

## 2023-09-19 LAB — CBC
Hematocrit: 44.2 % (ref 37.5–51.0)
Hemoglobin: 13.3 g/dL (ref 13.0–17.7)
MCH: 23.2 pg — ABNORMAL LOW (ref 26.6–33.0)
MCHC: 30.1 g/dL — ABNORMAL LOW (ref 31.5–35.7)
MCV: 77 fL — ABNORMAL LOW (ref 79–97)
Platelets: 302 10*3/uL (ref 150–450)
RBC: 5.73 x10E6/uL (ref 4.14–5.80)
RDW: 14.4 % (ref 11.6–15.4)
WBC: 5.8 10*3/uL (ref 3.4–10.8)

## 2023-09-19 LAB — LIPID PANEL
Chol/HDL Ratio: 4.6 ratio (ref 0.0–5.0)
Cholesterol, Total: 185 mg/dL (ref 100–199)
HDL: 40 mg/dL (ref >39)
LDL Chol Calc (NIH): 132 mg/dL — ABNORMAL HIGH (ref 0–99)
Triglycerides: 71 mg/dL (ref 0–149)
VLDL Cholesterol Cal: 13 mg/dL (ref 5–40)

## 2023-09-19 LAB — TSH: TSH: 1.04 u[IU]/mL (ref 0.450–4.500)

## 2023-12-12 ENCOUNTER — Ambulatory Visit: Payer: Self-pay | Admitting: Nurse Practitioner

## 2024-01-10 ENCOUNTER — Ambulatory Visit: Payer: Self-pay | Admitting: Nurse Practitioner

## 2024-02-24 ENCOUNTER — Ambulatory Visit: Payer: Self-pay | Admitting: Nurse Practitioner
